# Patient Record
Sex: Female | Born: 1955 | Race: Black or African American | Hispanic: No | Marital: Single | Smoking: Never smoker
Health system: Southern US, Community
[De-identification: ages and names within clinical notes are randomized; demographics above are authoritative.]

## PROBLEM LIST (undated history)

## (undated) HISTORY — PX: BUNIONECTOMY: SHX129

## (undated) HISTORY — PX: OTHER SURGICAL HISTORY: SHX169

---

## 2004-01-06 ENCOUNTER — Other Ambulatory Visit: Admission: RE | Admit: 2004-01-06 | Discharge: 2004-01-06 | Payer: Self-pay | Admitting: Obstetrics and Gynecology

## 2004-11-03 ENCOUNTER — Emergency Department (HOSPITAL_COMMUNITY): Admission: EM | Admit: 2004-11-03 | Discharge: 2004-11-04 | Payer: Self-pay | Admitting: Emergency Medicine

## 2005-06-15 ENCOUNTER — Other Ambulatory Visit: Admission: RE | Admit: 2005-06-15 | Discharge: 2005-06-15 | Payer: Self-pay | Admitting: Obstetrics and Gynecology

## 2005-11-02 ENCOUNTER — Encounter (HOSPITAL_COMMUNITY): Admission: RE | Admit: 2005-11-02 | Discharge: 2006-01-31 | Payer: Self-pay | Admitting: Internal Medicine

## 2005-11-14 ENCOUNTER — Ambulatory Visit (HOSPITAL_COMMUNITY): Admission: RE | Admit: 2005-11-14 | Discharge: 2005-11-14 | Payer: Self-pay | Admitting: Internal Medicine

## 2006-01-09 ENCOUNTER — Other Ambulatory Visit: Admission: RE | Admit: 2006-01-09 | Discharge: 2006-01-09 | Payer: Self-pay | Admitting: Interventional Radiology

## 2006-01-09 ENCOUNTER — Encounter: Admission: RE | Admit: 2006-01-09 | Discharge: 2006-01-09 | Payer: Self-pay | Admitting: Internal Medicine

## 2007-03-04 IMAGING — US US SOFT TISSUE HEAD/NECK
1 series · 14 of 25 positions shown · non-contrast
Comparison: Nuclear medicine scan on 11/03/05.

CLINICAL DATA: Patient has a thyroid nodule.
 THYROID ULTRASOUND:
TECHNIQUE: Ultrasound examination of the thyroid gland and adjacent soft tissue structures was performed.

[Series 1: unknown · 0.09mm/px · 14 of 58 slices shown]
[im 1/58]
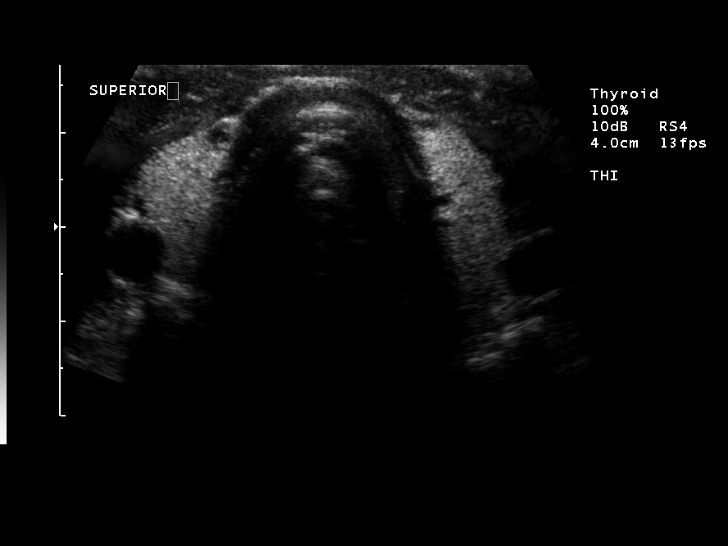
[im 5/58]
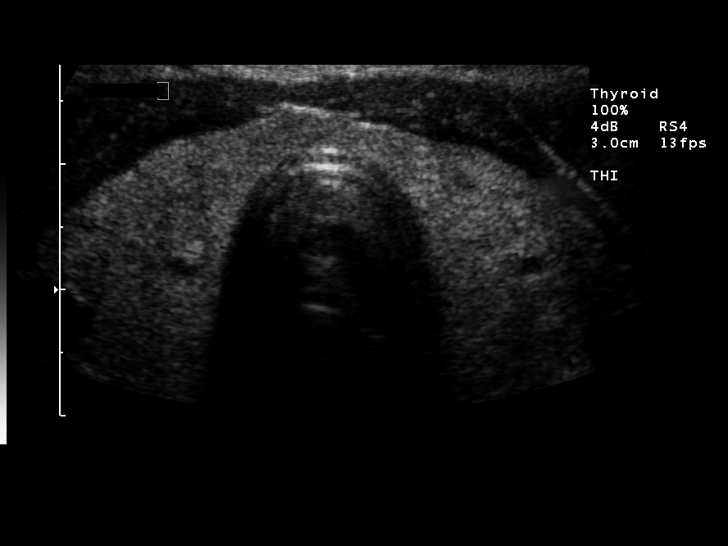
[im 10/58]
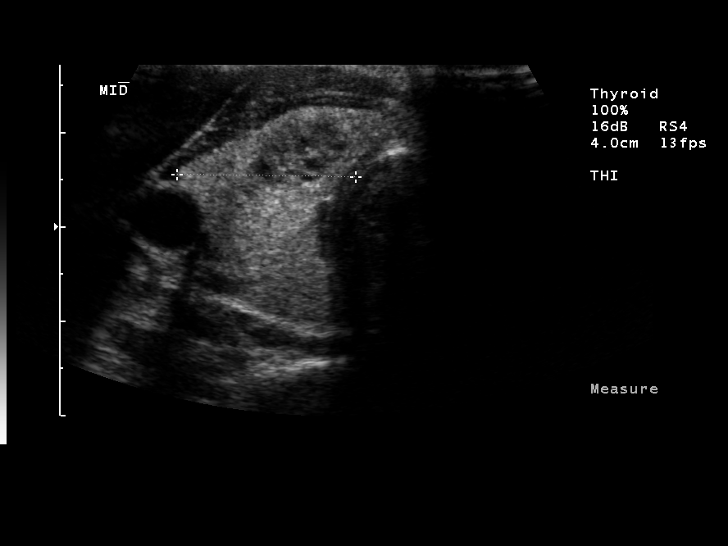
[im 15/58]
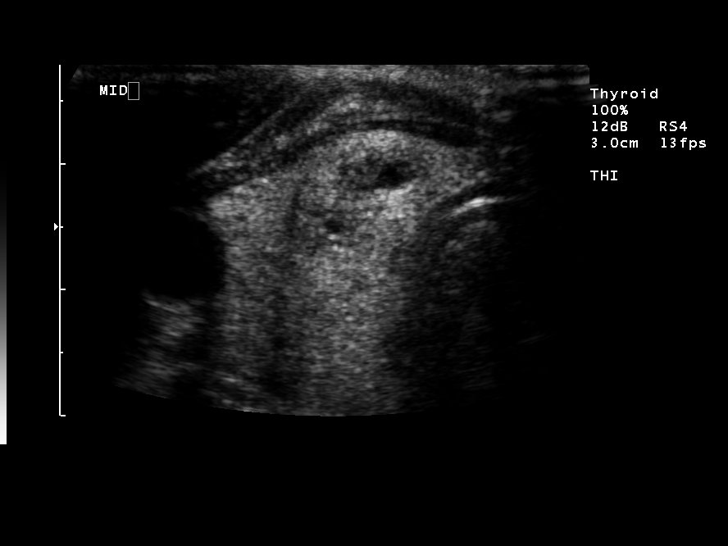
[im 20/58]
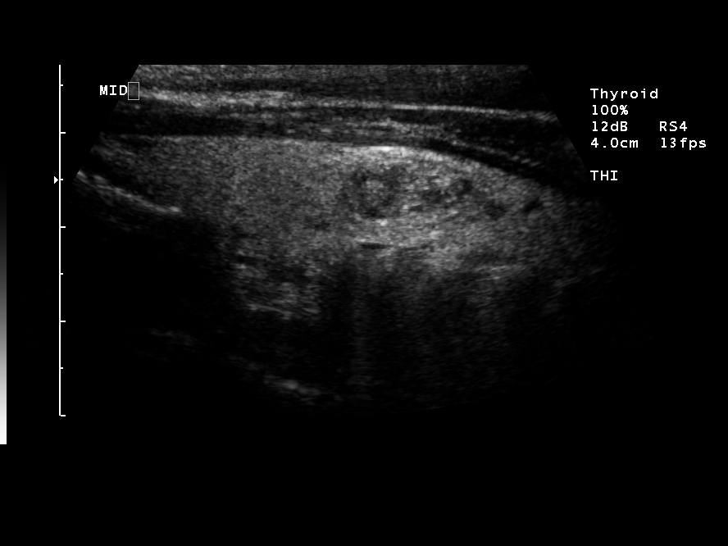
[im 22/58]
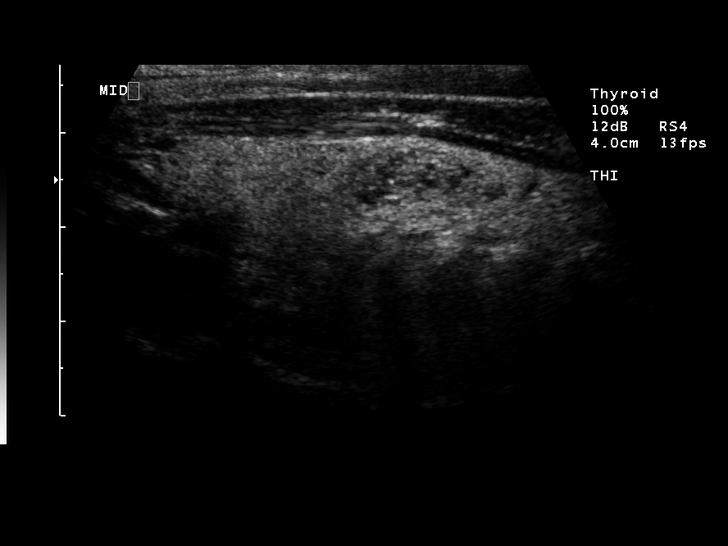
[im 27/58]
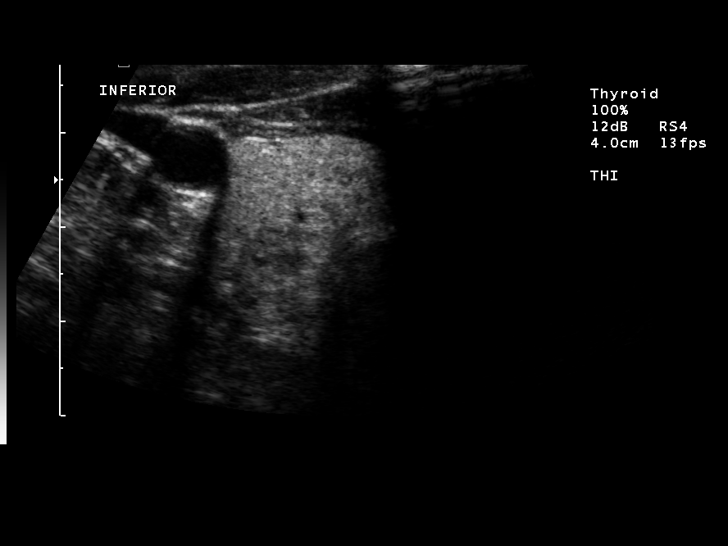
[im 31/58]
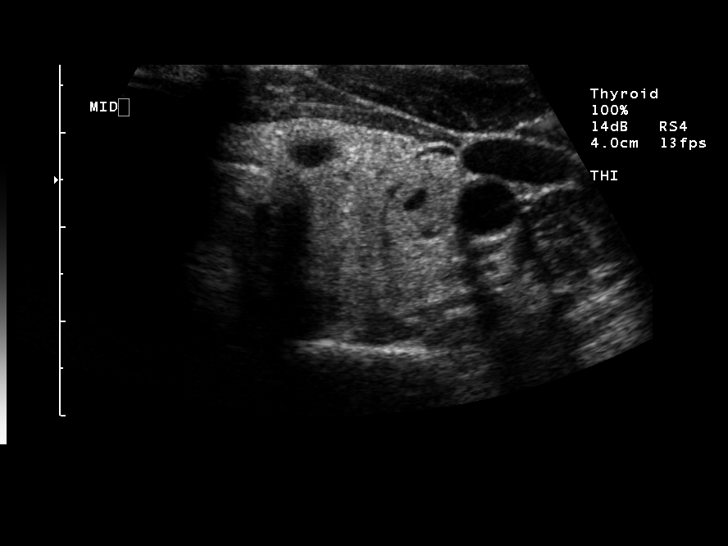
[im 36/58]
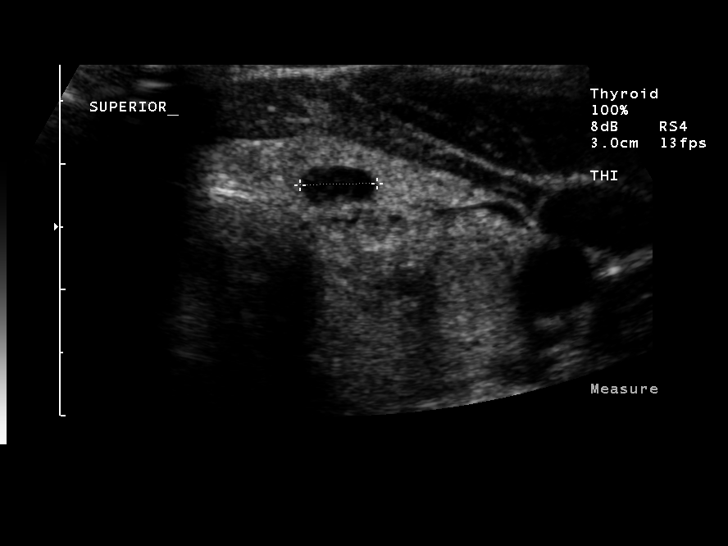
[im 39/58]
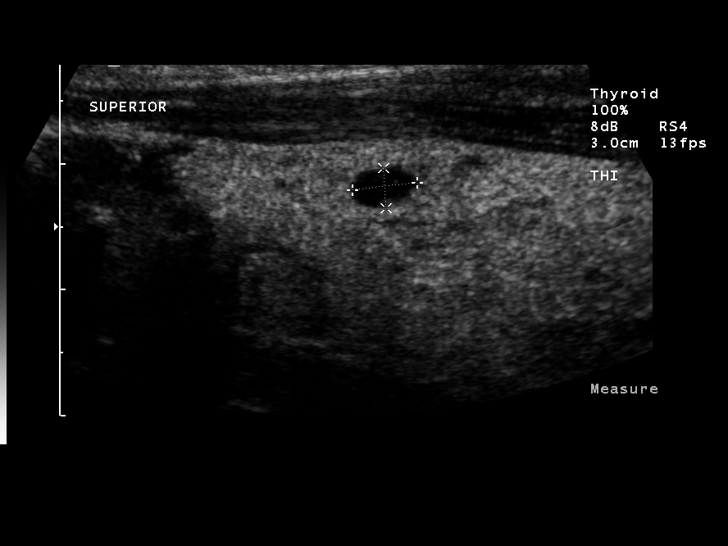
[im 43/58]
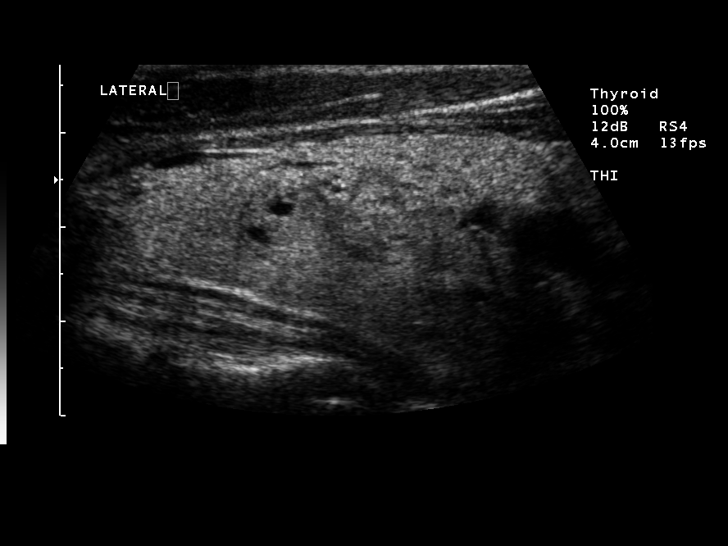
[im 48/58]
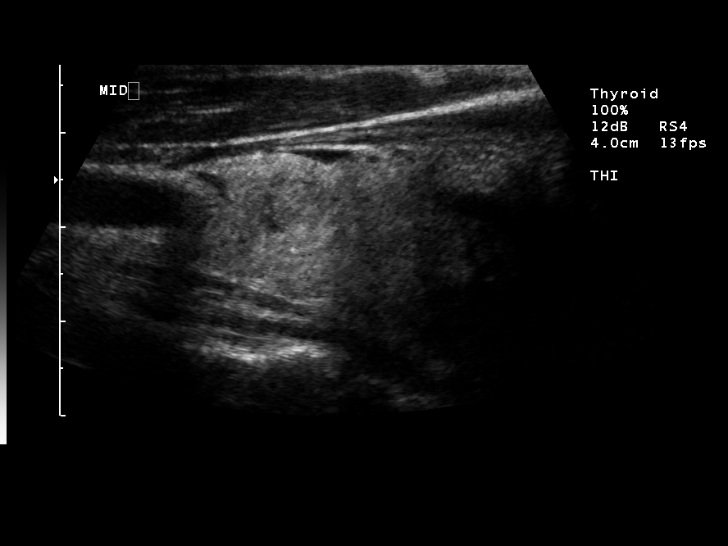
[im 53/58]
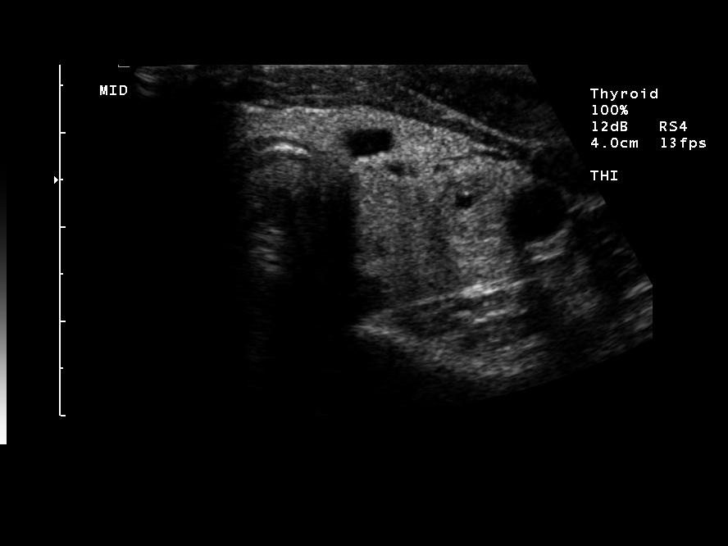
[im 58/58]
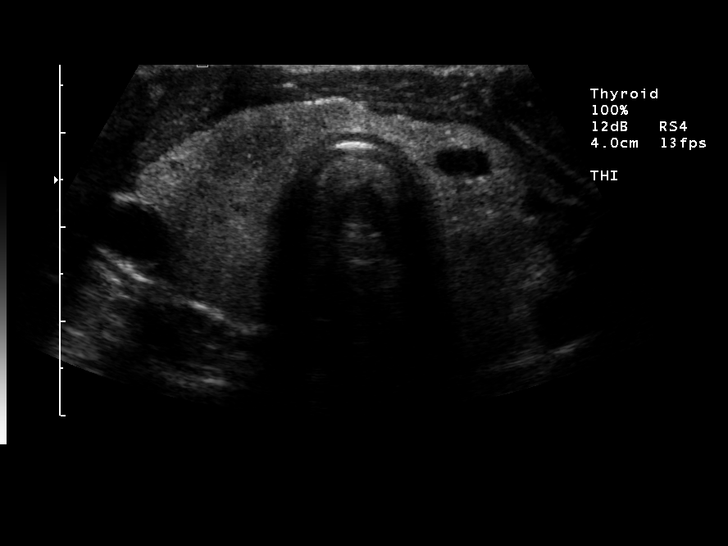

[14 of 25 positions shown; findings below may reference images not displayed]

Patient had a multinodular goiter on isotope with uptake of 11%.  TSH was .276 which is in the low normal range.
FINDINGS: Right lobe measures 5.8 x 2.4 x 1.9 cm and the left lobe measures 5.9 x 2.2 x 1.8 cm.  In the right lobe, there is a 1.5 cm nodule in the mid portion of the right lobe and a 1.1 cm solid nodule in the lower pole of the right lobe.  In the left lobe, there is a 5 x 6 mm cyst in the midpole and a 2.7 x 1.2 x 1.4 cm solid nodule in the lower pole on the left.
IMPRESSION: 1.  Multinodular goiter.  
 2.  Patient appears to be euthyroid.  
 3.  Biopsy however would be suggested of the largest nodule that being the 2.7 cm solid nodule in the left lobe.

## 2007-04-29 IMAGING — US US BIOPSY
1 series · 6 of 6 positions shown · non-contrast
Comparison: #1  Thyroid Ultrasound, 11/14/05 ? [HOSPITAL].
   #2  Nuclear Medicine Thyroid Scan and Uptake, 11/03/05 ? [HOSPITAL]

CLINICAL DATA: 49 year old female with cold nodule within left lobe of the thyroid.  Request for fine needle aspiration. 
 ULTRASOUND GUIDED FINE NEEDLE ASPIRATION, LEFT LOBE OF THYROID:

[Series 1: unknown · 0.06mm/px · 6 of 6 slices shown]
[im 1/6]
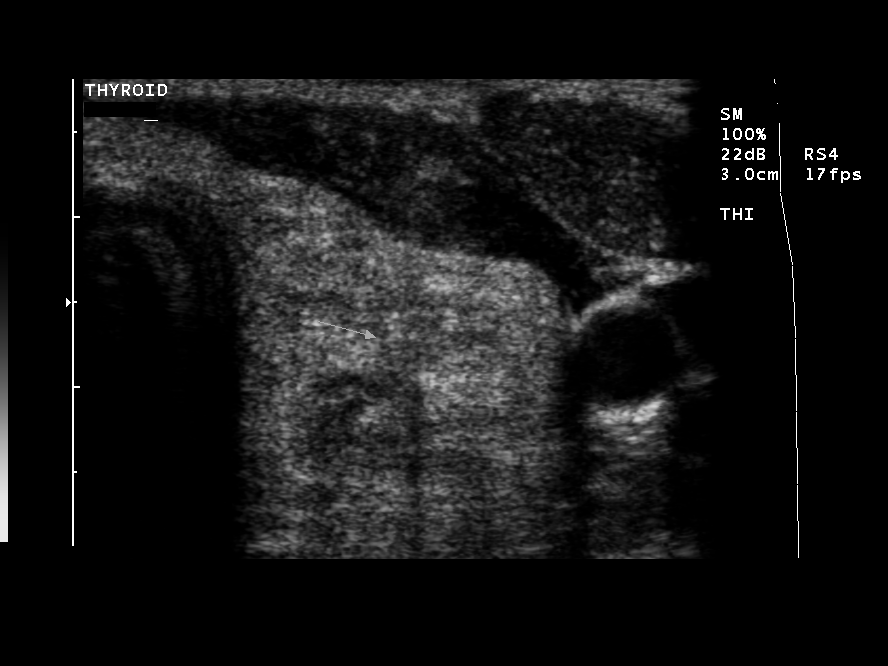
[im 2/6]
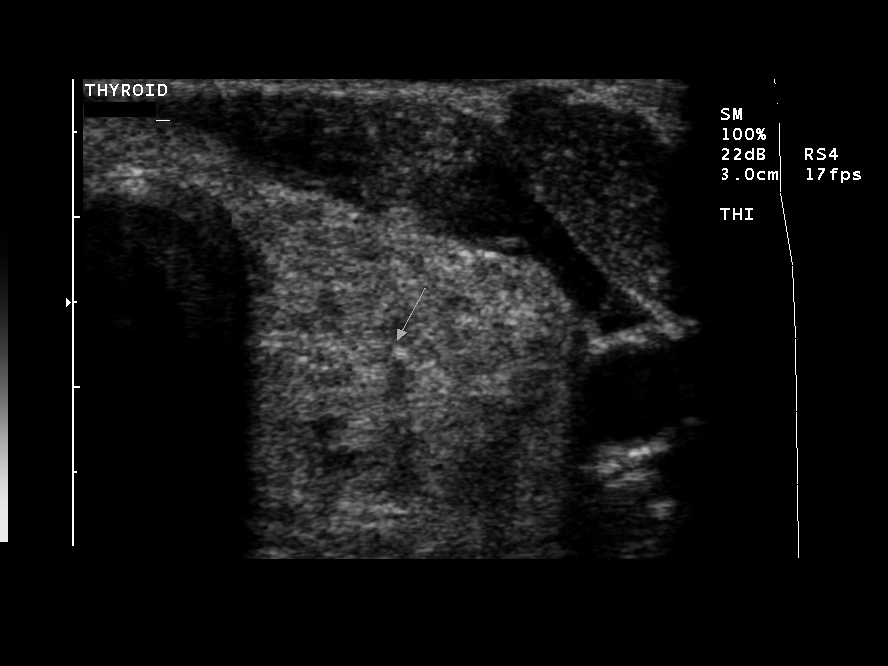
[im 3/6]
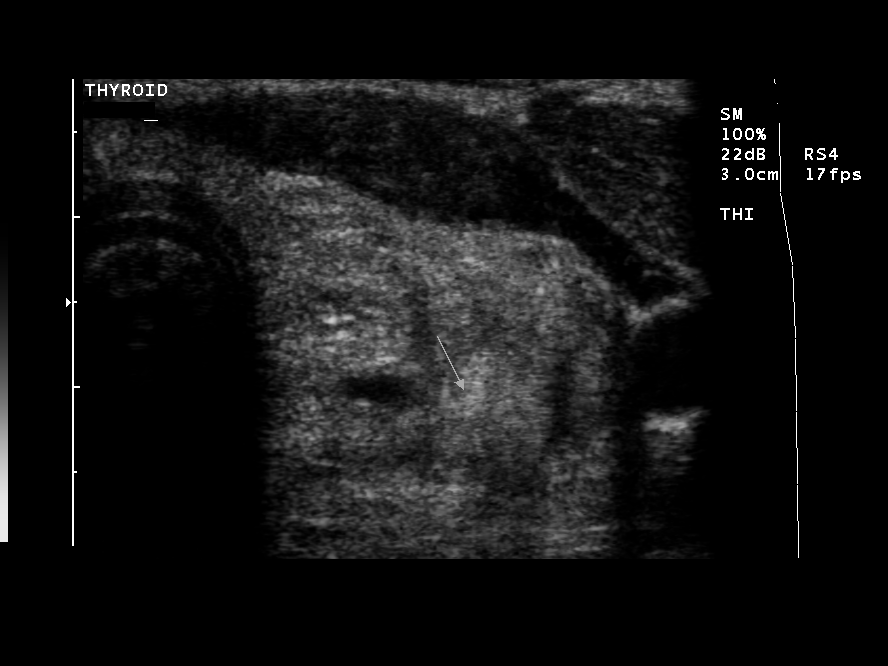
[im 4/6]
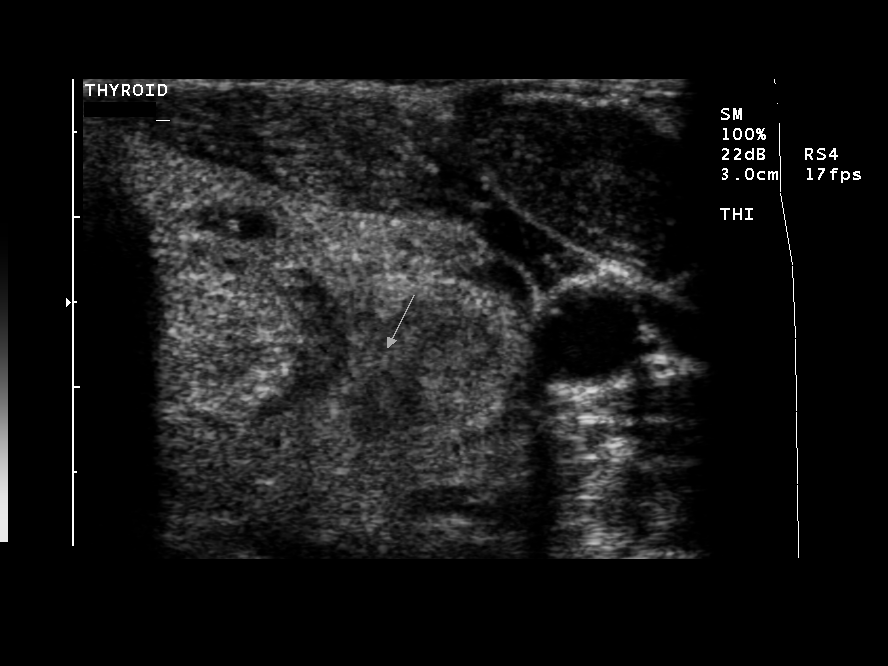
[im 5/6]
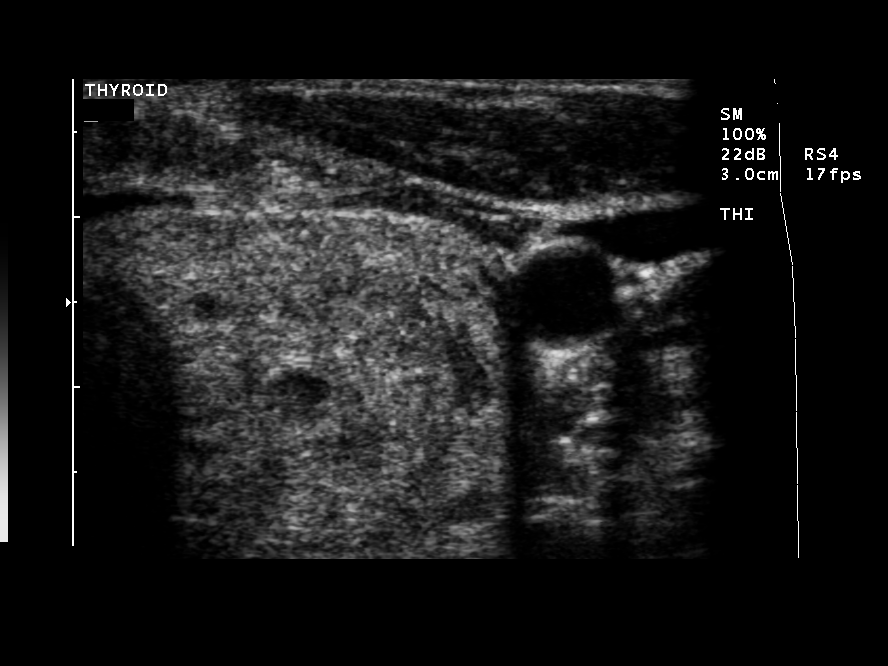
[im 6/6]
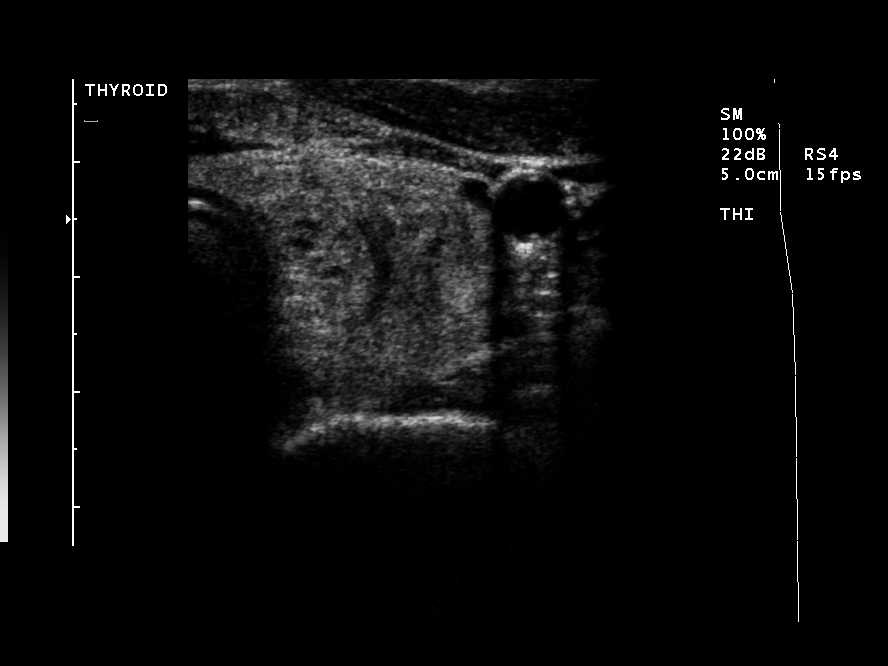

[6 of 6 positions shown; findings below may reference images not displayed]

FINDINGS: The above procedure was thoroughly discussed with the patient and written informed consent was obtained.
 Ultrasound was then performed to localize and mark an adequate site for the biopsy.  The patient was then prepped and draped in a normal sterile fashion, 1% Lidocaine was used for local anesthesia.  Using direct ultrasound guidance, four passes were made using a 25 gauge hypodermic needle into the dominant nodule located within the left lobe of the thyroid measuring approximately 2.7 cm in diameter.  Ultrasound confirmed placement of the needle on all four occasions.  The specimens were given to pathology for further analysis.  Post procedure imaging demonstrated no hematoma or immediate complication. The patient tolerated the procedure well.
IMPRESSION: Successful ultrasound guided fine needle aspiration, dominant nodule, left lobe of the thyroid.  Final pathology pending.

## 2010-12-24 ENCOUNTER — Other Ambulatory Visit: Payer: Self-pay | Admitting: Internal Medicine

## 2010-12-24 ENCOUNTER — Other Ambulatory Visit (HOSPITAL_COMMUNITY): Payer: Self-pay | Admitting: Internal Medicine

## 2010-12-24 DIAGNOSIS — Z1239 Encounter for other screening for malignant neoplasm of breast: Secondary | ICD-10-CM

## 2010-12-24 DIAGNOSIS — R928 Other abnormal and inconclusive findings on diagnostic imaging of breast: Secondary | ICD-10-CM

## 2017-10-05 ENCOUNTER — Other Ambulatory Visit: Payer: Self-pay | Admitting: Family Medicine

## 2017-10-05 DIAGNOSIS — R29898 Other symptoms and signs involving the musculoskeletal system: Secondary | ICD-10-CM

## 2017-10-29 ENCOUNTER — Other Ambulatory Visit: Payer: Self-pay

## 2017-12-20 ENCOUNTER — Ambulatory Visit: Payer: Self-pay | Admitting: Neurology

## 2020-12-07 DIAGNOSIS — H524 Presbyopia: Secondary | ICD-10-CM | POA: Diagnosis not present

## 2021-01-14 DIAGNOSIS — M4807 Spinal stenosis, lumbosacral region: Secondary | ICD-10-CM | POA: Diagnosis not present

## 2021-01-14 DIAGNOSIS — M47816 Spondylosis without myelopathy or radiculopathy, lumbar region: Secondary | ICD-10-CM | POA: Diagnosis not present

## 2021-01-14 DIAGNOSIS — M5136 Other intervertebral disc degeneration, lumbar region: Secondary | ICD-10-CM | POA: Diagnosis not present

## 2021-01-25 DIAGNOSIS — R2681 Unsteadiness on feet: Secondary | ICD-10-CM | POA: Diagnosis not present

## 2021-01-25 DIAGNOSIS — M6281 Muscle weakness (generalized): Secondary | ICD-10-CM | POA: Diagnosis not present

## 2021-01-25 DIAGNOSIS — M5125 Other intervertebral disc displacement, thoracolumbar region: Secondary | ICD-10-CM | POA: Diagnosis not present

## 2021-01-25 DIAGNOSIS — M549 Dorsalgia, unspecified: Secondary | ICD-10-CM | POA: Diagnosis not present

## 2021-02-22 DIAGNOSIS — R269 Unspecified abnormalities of gait and mobility: Secondary | ICD-10-CM | POA: Diagnosis not present

## 2021-02-22 DIAGNOSIS — M21372 Foot drop, left foot: Secondary | ICD-10-CM | POA: Diagnosis not present

## 2021-03-15 DIAGNOSIS — R29898 Other symptoms and signs involving the musculoskeletal system: Secondary | ICD-10-CM | POA: Diagnosis not present

## 2021-04-27 DIAGNOSIS — M47812 Spondylosis without myelopathy or radiculopathy, cervical region: Secondary | ICD-10-CM | POA: Diagnosis not present

## 2021-04-27 DIAGNOSIS — R269 Unspecified abnormalities of gait and mobility: Secondary | ICD-10-CM | POA: Diagnosis not present

## 2021-04-27 DIAGNOSIS — R2689 Other abnormalities of gait and mobility: Secondary | ICD-10-CM | POA: Diagnosis not present

## 2021-04-29 DIAGNOSIS — M6281 Muscle weakness (generalized): Secondary | ICD-10-CM | POA: Diagnosis not present

## 2021-04-29 DIAGNOSIS — R269 Unspecified abnormalities of gait and mobility: Secondary | ICD-10-CM | POA: Diagnosis not present

## 2021-05-13 DIAGNOSIS — M79662 Pain in left lower leg: Secondary | ICD-10-CM | POA: Diagnosis not present

## 2021-05-13 DIAGNOSIS — R296 Repeated falls: Secondary | ICD-10-CM | POA: Diagnosis not present

## 2021-05-13 DIAGNOSIS — M6281 Muscle weakness (generalized): Secondary | ICD-10-CM | POA: Diagnosis not present

## 2021-05-13 DIAGNOSIS — M2560 Stiffness of unspecified joint, not elsewhere classified: Secondary | ICD-10-CM | POA: Diagnosis not present

## 2021-05-13 DIAGNOSIS — R293 Abnormal posture: Secondary | ICD-10-CM | POA: Diagnosis not present

## 2021-05-13 DIAGNOSIS — R262 Difficulty in walking, not elsewhere classified: Secondary | ICD-10-CM | POA: Diagnosis not present

## 2021-05-24 DIAGNOSIS — M2560 Stiffness of unspecified joint, not elsewhere classified: Secondary | ICD-10-CM | POA: Diagnosis not present

## 2021-05-24 DIAGNOSIS — M79662 Pain in left lower leg: Secondary | ICD-10-CM | POA: Diagnosis not present

## 2021-05-24 DIAGNOSIS — R262 Difficulty in walking, not elsewhere classified: Secondary | ICD-10-CM | POA: Diagnosis not present

## 2021-05-24 DIAGNOSIS — R293 Abnormal posture: Secondary | ICD-10-CM | POA: Diagnosis not present

## 2021-05-24 DIAGNOSIS — M6281 Muscle weakness (generalized): Secondary | ICD-10-CM | POA: Diagnosis not present

## 2021-05-24 DIAGNOSIS — R296 Repeated falls: Secondary | ICD-10-CM | POA: Diagnosis not present

## 2021-05-26 DIAGNOSIS — M79662 Pain in left lower leg: Secondary | ICD-10-CM | POA: Diagnosis not present

## 2021-05-26 DIAGNOSIS — R296 Repeated falls: Secondary | ICD-10-CM | POA: Diagnosis not present

## 2021-05-26 DIAGNOSIS — M6281 Muscle weakness (generalized): Secondary | ICD-10-CM | POA: Diagnosis not present

## 2021-05-26 DIAGNOSIS — M2560 Stiffness of unspecified joint, not elsewhere classified: Secondary | ICD-10-CM | POA: Diagnosis not present

## 2021-05-26 DIAGNOSIS — R262 Difficulty in walking, not elsewhere classified: Secondary | ICD-10-CM | POA: Diagnosis not present

## 2021-05-26 DIAGNOSIS — R293 Abnormal posture: Secondary | ICD-10-CM | POA: Diagnosis not present

## 2021-06-01 DIAGNOSIS — M79662 Pain in left lower leg: Secondary | ICD-10-CM | POA: Diagnosis not present

## 2021-06-01 DIAGNOSIS — M2560 Stiffness of unspecified joint, not elsewhere classified: Secondary | ICD-10-CM | POA: Diagnosis not present

## 2021-06-01 DIAGNOSIS — R296 Repeated falls: Secondary | ICD-10-CM | POA: Diagnosis not present

## 2021-06-01 DIAGNOSIS — R262 Difficulty in walking, not elsewhere classified: Secondary | ICD-10-CM | POA: Diagnosis not present

## 2021-06-01 DIAGNOSIS — R293 Abnormal posture: Secondary | ICD-10-CM | POA: Diagnosis not present

## 2021-06-01 DIAGNOSIS — M6281 Muscle weakness (generalized): Secondary | ICD-10-CM | POA: Diagnosis not present

## 2021-06-04 DIAGNOSIS — M79662 Pain in left lower leg: Secondary | ICD-10-CM | POA: Diagnosis not present

## 2021-06-04 DIAGNOSIS — M2560 Stiffness of unspecified joint, not elsewhere classified: Secondary | ICD-10-CM | POA: Diagnosis not present

## 2021-06-04 DIAGNOSIS — R293 Abnormal posture: Secondary | ICD-10-CM | POA: Diagnosis not present

## 2021-06-04 DIAGNOSIS — M6281 Muscle weakness (generalized): Secondary | ICD-10-CM | POA: Diagnosis not present

## 2021-06-04 DIAGNOSIS — R262 Difficulty in walking, not elsewhere classified: Secondary | ICD-10-CM | POA: Diagnosis not present

## 2021-06-04 DIAGNOSIS — R296 Repeated falls: Secondary | ICD-10-CM | POA: Diagnosis not present

## 2021-06-08 DIAGNOSIS — M2560 Stiffness of unspecified joint, not elsewhere classified: Secondary | ICD-10-CM | POA: Diagnosis not present

## 2021-06-08 DIAGNOSIS — R293 Abnormal posture: Secondary | ICD-10-CM | POA: Diagnosis not present

## 2021-06-08 DIAGNOSIS — R262 Difficulty in walking, not elsewhere classified: Secondary | ICD-10-CM | POA: Diagnosis not present

## 2021-06-08 DIAGNOSIS — M6281 Muscle weakness (generalized): Secondary | ICD-10-CM | POA: Diagnosis not present

## 2021-06-08 DIAGNOSIS — M79662 Pain in left lower leg: Secondary | ICD-10-CM | POA: Diagnosis not present

## 2021-06-08 DIAGNOSIS — R296 Repeated falls: Secondary | ICD-10-CM | POA: Diagnosis not present

## 2021-06-11 DIAGNOSIS — R296 Repeated falls: Secondary | ICD-10-CM | POA: Diagnosis not present

## 2021-06-11 DIAGNOSIS — M2560 Stiffness of unspecified joint, not elsewhere classified: Secondary | ICD-10-CM | POA: Diagnosis not present

## 2021-06-11 DIAGNOSIS — R293 Abnormal posture: Secondary | ICD-10-CM | POA: Diagnosis not present

## 2021-06-11 DIAGNOSIS — M79662 Pain in left lower leg: Secondary | ICD-10-CM | POA: Diagnosis not present

## 2021-06-11 DIAGNOSIS — M6281 Muscle weakness (generalized): Secondary | ICD-10-CM | POA: Diagnosis not present

## 2021-06-11 DIAGNOSIS — R262 Difficulty in walking, not elsewhere classified: Secondary | ICD-10-CM | POA: Diagnosis not present

## 2021-06-15 DIAGNOSIS — R296 Repeated falls: Secondary | ICD-10-CM | POA: Diagnosis not present

## 2021-06-15 DIAGNOSIS — M2560 Stiffness of unspecified joint, not elsewhere classified: Secondary | ICD-10-CM | POA: Diagnosis not present

## 2021-06-15 DIAGNOSIS — R293 Abnormal posture: Secondary | ICD-10-CM | POA: Diagnosis not present

## 2021-06-15 DIAGNOSIS — R262 Difficulty in walking, not elsewhere classified: Secondary | ICD-10-CM | POA: Diagnosis not present

## 2021-06-15 DIAGNOSIS — M6281 Muscle weakness (generalized): Secondary | ICD-10-CM | POA: Diagnosis not present

## 2021-06-15 DIAGNOSIS — M79662 Pain in left lower leg: Secondary | ICD-10-CM | POA: Diagnosis not present

## 2021-06-18 DIAGNOSIS — R262 Difficulty in walking, not elsewhere classified: Secondary | ICD-10-CM | POA: Diagnosis not present

## 2021-06-18 DIAGNOSIS — R296 Repeated falls: Secondary | ICD-10-CM | POA: Diagnosis not present

## 2021-06-18 DIAGNOSIS — M6281 Muscle weakness (generalized): Secondary | ICD-10-CM | POA: Diagnosis not present

## 2021-06-18 DIAGNOSIS — M79662 Pain in left lower leg: Secondary | ICD-10-CM | POA: Diagnosis not present

## 2021-06-18 DIAGNOSIS — M2560 Stiffness of unspecified joint, not elsewhere classified: Secondary | ICD-10-CM | POA: Diagnosis not present

## 2021-06-18 DIAGNOSIS — R293 Abnormal posture: Secondary | ICD-10-CM | POA: Diagnosis not present

## 2021-06-28 DIAGNOSIS — H43313 Vitreous membranes and strands, bilateral: Secondary | ICD-10-CM | POA: Diagnosis not present

## 2021-06-28 DIAGNOSIS — H5340 Unspecified visual field defects: Secondary | ICD-10-CM | POA: Diagnosis not present

## 2021-06-29 DIAGNOSIS — M6281 Muscle weakness (generalized): Secondary | ICD-10-CM | POA: Diagnosis not present

## 2021-06-29 DIAGNOSIS — M79662 Pain in left lower leg: Secondary | ICD-10-CM | POA: Diagnosis not present

## 2021-06-29 DIAGNOSIS — M2560 Stiffness of unspecified joint, not elsewhere classified: Secondary | ICD-10-CM | POA: Diagnosis not present

## 2021-06-29 DIAGNOSIS — R262 Difficulty in walking, not elsewhere classified: Secondary | ICD-10-CM | POA: Diagnosis not present

## 2021-06-29 DIAGNOSIS — R293 Abnormal posture: Secondary | ICD-10-CM | POA: Diagnosis not present

## 2021-06-29 DIAGNOSIS — R296 Repeated falls: Secondary | ICD-10-CM | POA: Diagnosis not present

## 2021-07-02 DIAGNOSIS — M79662 Pain in left lower leg: Secondary | ICD-10-CM | POA: Diagnosis not present

## 2021-07-02 DIAGNOSIS — R296 Repeated falls: Secondary | ICD-10-CM | POA: Diagnosis not present

## 2021-07-02 DIAGNOSIS — M6281 Muscle weakness (generalized): Secondary | ICD-10-CM | POA: Diagnosis not present

## 2021-07-02 DIAGNOSIS — M2560 Stiffness of unspecified joint, not elsewhere classified: Secondary | ICD-10-CM | POA: Diagnosis not present

## 2021-07-02 DIAGNOSIS — R262 Difficulty in walking, not elsewhere classified: Secondary | ICD-10-CM | POA: Diagnosis not present

## 2021-07-02 DIAGNOSIS — R293 Abnormal posture: Secondary | ICD-10-CM | POA: Diagnosis not present

## 2021-07-06 DIAGNOSIS — M79662 Pain in left lower leg: Secondary | ICD-10-CM | POA: Diagnosis not present

## 2021-07-06 DIAGNOSIS — R262 Difficulty in walking, not elsewhere classified: Secondary | ICD-10-CM | POA: Diagnosis not present

## 2021-07-06 DIAGNOSIS — R293 Abnormal posture: Secondary | ICD-10-CM | POA: Diagnosis not present

## 2021-07-06 DIAGNOSIS — M2560 Stiffness of unspecified joint, not elsewhere classified: Secondary | ICD-10-CM | POA: Diagnosis not present

## 2021-07-06 DIAGNOSIS — R296 Repeated falls: Secondary | ICD-10-CM | POA: Diagnosis not present

## 2021-07-06 DIAGNOSIS — M6281 Muscle weakness (generalized): Secondary | ICD-10-CM | POA: Diagnosis not present

## 2021-07-09 DIAGNOSIS — M2560 Stiffness of unspecified joint, not elsewhere classified: Secondary | ICD-10-CM | POA: Diagnosis not present

## 2021-07-09 DIAGNOSIS — M79662 Pain in left lower leg: Secondary | ICD-10-CM | POA: Diagnosis not present

## 2021-07-09 DIAGNOSIS — R293 Abnormal posture: Secondary | ICD-10-CM | POA: Diagnosis not present

## 2021-07-09 DIAGNOSIS — M6281 Muscle weakness (generalized): Secondary | ICD-10-CM | POA: Diagnosis not present

## 2021-07-09 DIAGNOSIS — R262 Difficulty in walking, not elsewhere classified: Secondary | ICD-10-CM | POA: Diagnosis not present

## 2021-07-09 DIAGNOSIS — R296 Repeated falls: Secondary | ICD-10-CM | POA: Diagnosis not present

## 2021-07-13 DIAGNOSIS — R293 Abnormal posture: Secondary | ICD-10-CM | POA: Diagnosis not present

## 2021-07-13 DIAGNOSIS — M6281 Muscle weakness (generalized): Secondary | ICD-10-CM | POA: Diagnosis not present

## 2021-07-13 DIAGNOSIS — R296 Repeated falls: Secondary | ICD-10-CM | POA: Diagnosis not present

## 2021-07-13 DIAGNOSIS — M2560 Stiffness of unspecified joint, not elsewhere classified: Secondary | ICD-10-CM | POA: Diagnosis not present

## 2021-07-13 DIAGNOSIS — M79662 Pain in left lower leg: Secondary | ICD-10-CM | POA: Diagnosis not present

## 2021-07-13 DIAGNOSIS — R262 Difficulty in walking, not elsewhere classified: Secondary | ICD-10-CM | POA: Diagnosis not present

## 2021-07-16 DIAGNOSIS — M2560 Stiffness of unspecified joint, not elsewhere classified: Secondary | ICD-10-CM | POA: Diagnosis not present

## 2021-07-16 DIAGNOSIS — M6281 Muscle weakness (generalized): Secondary | ICD-10-CM | POA: Diagnosis not present

## 2021-07-16 DIAGNOSIS — R293 Abnormal posture: Secondary | ICD-10-CM | POA: Diagnosis not present

## 2021-07-16 DIAGNOSIS — R296 Repeated falls: Secondary | ICD-10-CM | POA: Diagnosis not present

## 2021-07-16 DIAGNOSIS — R262 Difficulty in walking, not elsewhere classified: Secondary | ICD-10-CM | POA: Diagnosis not present

## 2021-07-16 DIAGNOSIS — M79662 Pain in left lower leg: Secondary | ICD-10-CM | POA: Diagnosis not present

## 2021-07-20 DIAGNOSIS — R262 Difficulty in walking, not elsewhere classified: Secondary | ICD-10-CM | POA: Diagnosis not present

## 2021-07-20 DIAGNOSIS — M79662 Pain in left lower leg: Secondary | ICD-10-CM | POA: Diagnosis not present

## 2021-07-20 DIAGNOSIS — M6281 Muscle weakness (generalized): Secondary | ICD-10-CM | POA: Diagnosis not present

## 2021-07-20 DIAGNOSIS — R296 Repeated falls: Secondary | ICD-10-CM | POA: Diagnosis not present

## 2021-07-20 DIAGNOSIS — M2560 Stiffness of unspecified joint, not elsewhere classified: Secondary | ICD-10-CM | POA: Diagnosis not present

## 2021-07-20 DIAGNOSIS — R293 Abnormal posture: Secondary | ICD-10-CM | POA: Diagnosis not present

## 2021-07-23 DIAGNOSIS — R296 Repeated falls: Secondary | ICD-10-CM | POA: Diagnosis not present

## 2021-07-23 DIAGNOSIS — M6281 Muscle weakness (generalized): Secondary | ICD-10-CM | POA: Diagnosis not present

## 2021-07-23 DIAGNOSIS — R293 Abnormal posture: Secondary | ICD-10-CM | POA: Diagnosis not present

## 2021-07-23 DIAGNOSIS — M79662 Pain in left lower leg: Secondary | ICD-10-CM | POA: Diagnosis not present

## 2021-07-23 DIAGNOSIS — M2560 Stiffness of unspecified joint, not elsewhere classified: Secondary | ICD-10-CM | POA: Diagnosis not present

## 2021-07-23 DIAGNOSIS — R262 Difficulty in walking, not elsewhere classified: Secondary | ICD-10-CM | POA: Diagnosis not present

## 2021-08-09 ENCOUNTER — Telehealth: Payer: Self-pay | Admitting: *Deleted

## 2021-08-09 ENCOUNTER — Encounter: Payer: Self-pay | Admitting: Neurology

## 2021-08-09 ENCOUNTER — Other Ambulatory Visit: Payer: Self-pay

## 2021-08-09 ENCOUNTER — Ambulatory Visit: Payer: Medicare Other | Admitting: Neurology

## 2021-08-09 VITALS — BP 113/73 | HR 72 | Ht 62.0 in | Wt 116.0 lb

## 2021-08-09 DIAGNOSIS — R799 Abnormal finding of blood chemistry, unspecified: Secondary | ICD-10-CM | POA: Diagnosis not present

## 2021-08-09 DIAGNOSIS — G35 Multiple sclerosis: Secondary | ICD-10-CM | POA: Insufficient documentation

## 2021-08-09 DIAGNOSIS — R269 Unspecified abnormalities of gait and mobility: Secondary | ICD-10-CM

## 2021-08-09 DIAGNOSIS — R7989 Other specified abnormal findings of blood chemistry: Secondary | ICD-10-CM | POA: Diagnosis not present

## 2021-08-09 DIAGNOSIS — E538 Deficiency of other specified B group vitamins: Secondary | ICD-10-CM | POA: Diagnosis not present

## 2021-08-09 DIAGNOSIS — E079 Disorder of thyroid, unspecified: Secondary | ICD-10-CM | POA: Diagnosis not present

## 2021-08-09 DIAGNOSIS — R7309 Other abnormal glucose: Secondary | ICD-10-CM | POA: Diagnosis not present

## 2021-08-09 NOTE — Telephone Encounter (Signed)
Placed JCV antibody specimen out in the [facility] box for pick up.  

## 2021-08-09 NOTE — Progress Notes (Signed)
Chief Complaint  Patient presents with   New Patient (Initial Visit)    Rm 16, with husband, here to discuss gait abnormalities, report no pain, using cane, reports many falls, around 10 in the last 4 weeks        ASSESSMENT AND PLAN  Lauren Snyder is a 65 y.o. female   Progressive worsening gait abnormality since 2016  Significant abnormal MRI of the brain and cervical spine, most consistent with multiple sclerosis, patient did not give a clear relapsing admitting episode  Repeat MRI of the brain and cervical spine with without contrast  Laboratory evaluations  Will also consider lumbar puncture  Return to clinic in 5 to 6 weeks to discuss treatment options   DIAGNOSTIC DATA (LABS, IMAGING, TESTING) - I reviewed patient records, labs, notes, testing and imaging myself where available. EMG nerve conduction study on March 15, 2021, of bilateral lower extremity was within normal limit, no evidence of polyneuropathy, limited needle examination showed no significant abnormality  Bilateral peroneal to EDB, tibial motor responses were normal.  Bilateral sural sensory responses were normal, H reflex were normal and symmetric  MRI of the brain on July 28, 2021 reported no evidence of acute abnormality, moderate to advanced chronic periventricular white matter changes, some of oval-shaped, perpendicular to ventricle MRI of cervical spine old, called volume loss, abnormal T2 signal at the C2 level, favors demyelinating disease, mild multilevel degenerative changes    MEDICAL HISTORY:  Lauren Snyder is a 65 year old female, seen in request by her neurosurgeon Dr. Coletta Memos, evaluation of abnormal MRIs, gait abnormality, her primary care physician is Dr. Mikeal Hawthorne, Cheri Rous, initial evaluation was on August 09, 2021  I reviewed and summarized the referring note. PMHx  She retired around 2016, even before retirement, she noted mild gait abnormality, complaints she often   tripped on the hallway, her left leg gave her trouble,  Since retirement, she becomes increased sedentary, at the same time, she noticed increased gait abnormality, dragging her left leg more, now began to fall frequently, rely on forefoot cane, she denies bowel and bladder incontinence, denies visual loss, denies dysarthria or dysphagia  She denies significant relapsing remitting multiple sclerosis   Around 2017, she was noted to have memory loss, tends to repeat herself, gradually getting worse, spend a lot of time in front of TV  We personally reviewed MRI of the brain, and cervical spine without contrast from [facility]  MRI of the brain on July 28, 2021 reported no evidence of acute abnormality, moderate to advanced chronic periventricular white matter changes, some of oval-shaped, perpendicular to ventricle  MRI of cervical spine old, called volume loss, abnormal T2 signal at the C2 level, favors demyelinating disease, mild multilevel degenerative changes  PHYSICAL EXAM:   Vitals:   08/09/21 1344  BP: 113/73  Pulse: 72  Weight: 116 lb (52.6 kg)  Height: 5\' 2"  (1.575 m)   Not recorded     Body mass index is 21.22 kg/m.  PHYSICAL EXAMNIATION:  Gen: NAD, conversant, well nourised, well groomed                     Cardiovascular: Regular rate rhythm, no peripheral edema, warm, nontender. Eyes: Conjunctivae clear without exudates or hemorrhage Neck: Supple, no carotid bruits. Pulmonary: Clear to auscultation bilaterally   NEUROLOGICAL EXAM:  MENTAL STATUS: Speech:    Speech is normal; fluent and spontaneous with normal comprehension.  Cognition:     Orientation to  time, place and person     Normal recent and remote memory     Normal Attention span and concentration     Normal Language, naming, repeating,spontaneous speech     Fund of knowledge   CRANIAL NERVES: CN II: Visual fields are full to confrontation. Pupils are round equal and briskly  reactive to light. CN III, IV, VI: extraocular movement are normal. No ptosis. CN V: Facial sensation is intact to light touch CN VII: Face is symmetric with normal eye closure  CN VIII: Hearing is normal to causal conversation. CN IX, X: Phonation is normal. CN XI: Head turning and shoulder shrug are intact  MOTOR: Fixation of left arm on rapid rotating movement, mild left shoulder abduction weakness,  REFLEXES: Reflexes are 2+ and symmetric at the biceps, triceps, 2/3 knees, and ankles. Plantar responses are flexor on the right, extensor on left.  SENSORY: Intact to light touch, pinprick and vibratory sensation are intact in fingers and toes.  COORDINATION: There is no trunk or limb dysmetria noted.  GAIT/STANCE: She needs push-up to get up from seated position, unsteady, stiff, dragging left leg  REVIEW OF SYSTEMS:  Full 14 system review of systems performed and notable only for as above All other review of systems were negative.   ALLERGIES: No Known Allergies  HOME MEDICATIONS: No current outpatient medications on file.   No current facility-administered medications for this visit.    PAST MEDICAL HISTORY: History reviewed. No pertinent past medical history.  PAST SURGICAL HISTORY: Past Surgical History:  Procedure Laterality Date   BUNIONECTOMY     fibroid removal      FAMILY HISTORY: History reviewed. No pertinent family history.  SOCIAL HISTORY: Social History   Socioeconomic History   Marital status: Single    Spouse name: Not on file   Number of children: Not on file   Years of education: Not on file   Highest education level: Not on file  Occupational History   Not on file  Tobacco Use   Smoking status: Never   Smokeless tobacco: Never  Substance and Sexual Activity   Alcohol use: Never   Drug use: Never   Sexual activity: Not on file  Other Topics Concern   Not on file  Social History Narrative   Not on file   Social Determinants of  Health   Financial Resource Strain: Not on file  Food Insecurity: Not on file  Transportation Needs: Not on file  Physical Activity: Not on file  Stress: Not on file  Social Connections: Not on file  Intimate Partner Violence: Not on file      Levert Feinstein, M.D. Ph.D.  [facility] [address] [place], [place] [address] Ph: [phone] Fax: [phone]  CC:  Coletta Memos, MD [address] [place],  [place] [address]  Rometta Emery, MD

## 2021-08-12 ENCOUNTER — Telehealth: Payer: Self-pay | Admitting: Neurology

## 2021-08-12 LAB — FERRITIN: Ferritin: 98 ng/mL (ref 15–150)

## 2021-08-12 LAB — CBC WITH DIFFERENTIAL/PLATELET
Basophils Absolute: 0 10*3/uL (ref 0.0–0.2)
Basos: 1 %
EOS (ABSOLUTE): 0.2 10*3/uL (ref 0.0–0.4)
Eos: 5 %
Hematocrit: 37.1 % (ref 34.0–46.6)
Hemoglobin: 12.9 g/dL (ref 11.1–15.9)
Immature Grans (Abs): 0 10*3/uL (ref 0.0–0.1)
Immature Granulocytes: 0 %
Lymphocytes Absolute: 0.8 10*3/uL (ref 0.7–3.1)
Lymphs: 27 %
MCH: 30.6 pg (ref 26.6–33.0)
MCHC: 34.8 g/dL (ref 31.5–35.7)
MCV: 88 fL (ref 79–97)
Monocytes Absolute: 0.3 10*3/uL (ref 0.1–0.9)
Monocytes: 12 %
Neutrophils Absolute: 1.6 10*3/uL (ref 1.4–7.0)
Neutrophils: 55 %
Platelets: 278 10*3/uL (ref 150–450)
RBC: 4.22 x10E6/uL (ref 3.77–5.28)
RDW: 13.1 % (ref 11.7–15.4)
WBC: 2.8 10*3/uL — ABNORMAL LOW (ref 3.4–10.8)

## 2021-08-12 LAB — VITAMIN B12: Vitamin B-12: 919 pg/mL (ref 232–1245)

## 2021-08-12 LAB — VITAMIN D 25 HYDROXY (VIT D DEFICIENCY, FRACTURES): Vit D, 25-Hydroxy: 33.1 ng/mL (ref 30.0–100.0)

## 2021-08-12 LAB — HEPATITIS B SURFACE ANTIGEN: Hepatitis B Surface Ag: NEGATIVE

## 2021-08-12 LAB — MULTIPLE MYELOMA PANEL, SERUM
Albumin SerPl Elph-Mcnc: 4 g/dL (ref 2.9–4.4)
Albumin/Glob SerPl: 1.2 (ref 0.7–1.7)
Alpha 1: 0.2 g/dL (ref 0.0–0.4)
Alpha2 Glob SerPl Elph-Mcnc: 0.7 g/dL (ref 0.4–1.0)
B-Globulin SerPl Elph-Mcnc: 1 g/dL (ref 0.7–1.3)
Gamma Glob SerPl Elph-Mcnc: 1.5 g/dL (ref 0.4–1.8)
Globulin, Total: 3.4 g/dL (ref 2.2–3.9)
IgA/Immunoglobulin A, Serum: 257 mg/dL (ref 87–352)
IgG (Immunoglobin G), Serum: 1451 mg/dL (ref 586–1602)
IgM (Immunoglobulin M), Srm: 107 mg/dL (ref 26–217)
Total Protein: 7.4 g/dL (ref 6.0–8.5)

## 2021-08-12 LAB — C-REACTIVE PROTEIN: CRP: <1 mg/L (ref 0–10)

## 2021-08-12 LAB — HGB A1C W/O EAG: Hgb A1c MFr Bld: 5.5 % (ref 4.8–5.6)

## 2021-08-12 LAB — TSH: TSH: 1.28 u[IU]/mL (ref 0.450–4.500)

## 2021-08-12 LAB — HIV ANTIBODY (ROUTINE TESTING W REFLEX): HIV Screen 4th Generation wRfx: NONREACTIVE

## 2021-08-12 LAB — HEPATITIS C ANTIBODY: Hep C Virus Ab: <0.1 s/co ratio (ref 0.0–0.9)

## 2021-08-12 LAB — COPPER, SERUM: Copper: 123 ug/dL (ref 80–158)

## 2021-08-12 LAB — CK: Total CK: 152 U/L (ref 32–182)

## 2021-08-12 LAB — VARICELLA ZOSTER ANTIBODY, IGG: Varicella zoster IgG: 2377 index (ref >165)

## 2021-08-12 LAB — LYME DISEASE SEROLOGY W/REFLEX: Lyme Total Antibody EIA: NEGATIVE

## 2021-08-12 LAB — HEPATITIS B SURFACE ANTIBODY,QUALITATIVE: Hep B Surface Ab, Qual: NONREACTIVE

## 2021-08-12 LAB — ANA W/REFLEX IF POSITIVE: Anti Nuclear Antibody (ANA): NEGATIVE

## 2021-08-12 LAB — SEDIMENTATION RATE: Sed Rate: 11 mm/hr (ref 0–40)

## 2021-08-12 LAB — RPR: RPR Ser Ql: NONREACTIVE

## 2021-08-12 LAB — HEPATITIS B CORE ANTIBODY, TOTAL: Hep B Core Total Ab: NEGATIVE

## 2021-08-12 LAB — FOLATE: Folate: 15.9 ng/mL (ref >3.0)

## 2021-08-12 NOTE — Telephone Encounter (Signed)
I spoke to the patient. She verbalized understanding of the lab findings.

## 2021-08-12 NOTE — Telephone Encounter (Signed)
LVM for patient to return call.  If patient calls back, please inform her of Dr. Zannie Cove review and findings regarding lab work she had completed.      the only abnormality at laboratory evaluation was mildly decreased WBC of 2.8, this has unknown clinical significance, will have repeat test at her next follow-up visit  Rest of the laboratory evaluation showed no significant abnormality,          Thank you, Cicero Duck, RN

## 2021-08-12 NOTE — Telephone Encounter (Signed)
Please call patient, the only abnormality at laboratory evaluation was mildly decreased WBC of 2.8, this has unknown clinical significance, will have repeat test at her next follow-up visit  Rest of the laboratory evaluation showed no significant abnormality,

## 2021-08-16 NOTE — Telephone Encounter (Signed)
Labs collected 08/09/21:  JCV ab: 2.69 - high

## 2021-08-30 DIAGNOSIS — R030 Elevated blood-pressure reading, without diagnosis of hypertension: Secondary | ICD-10-CM | POA: Diagnosis not present

## 2021-08-30 DIAGNOSIS — R269 Unspecified abnormalities of gait and mobility: Secondary | ICD-10-CM | POA: Diagnosis not present

## 2021-09-10 ENCOUNTER — Ambulatory Visit
Admission: RE | Admit: 2021-09-10 | Discharge: 2021-09-10 | Disposition: A | Discharge status: Home / Self Care | Payer: Self-pay | Source: Ambulatory Visit | Attending: Neurology | Admitting: Neurology

## 2021-09-10 ENCOUNTER — Other Ambulatory Visit: Payer: Self-pay

## 2021-09-10 DIAGNOSIS — G35 Multiple sclerosis: Secondary | ICD-10-CM | POA: Diagnosis not present

## 2021-09-10 MED ORDER — GADOBENATE DIMEGLUMINE 529 MG/ML IV SOLN
10.0000 mL | Freq: Once | INTRAVENOUS | Status: AC | PRN
  Administered 2021-09-10: 10 mL via INTRAVENOUS

## 2021-09-13 ENCOUNTER — Telehealth: Payer: Self-pay | Admitting: Neurology

## 2021-09-13 DIAGNOSIS — R269 Unspecified abnormalities of gait and mobility: Secondary | ICD-10-CM

## 2021-09-13 DIAGNOSIS — G35 Multiple sclerosis: Secondary | ICD-10-CM

## 2021-09-13 NOTE — Telephone Encounter (Signed)
IMPRESSION: This MRI of the cervical spine with and without contrast shows the following: 1.   T2 hyperintense focus associated with mild spinal cord atrophy adjacent to C2.  This appears unchanged compared to the previous MRI.  It does not enhance.  This is consistent with a chronic demyelinating plaque from multiple sclerosis or transverse myelitis. 2.   Mild degenerative changes at C3-C4 and C4-C5 that do not lead to spinal stenosis or nerve root compression.  IMPRESSION: This MRI of the brain with and without contrast shows the following: 1.   Multiple single and confluent T2/FLAIR hyperintense foci in the periventricular, juxtacortical and deep white matter of the hemispheres.  Foci are also noted at the lateral right thalamus and adjacent to C2.  These are consistent with chronic demyelinating plaque associated with multiple sclerosis.  Compared to the MRI from 04/17/2021.  There are no new or enhancing lesions. 2.   Small chronic microhemorrhage in the left posterior pontomesencephalic junction, also seen on the previous MRI.  This could also represent a small cerebral angioma. 3.   No acute findings.  Normal enhancement pattern.   Please call patient, MRI of the brain and cervical cord showed lesions most consistent with multiple sclerosis, no contrast-enhancement  Extensive laboratory evaluation showed decreased WBC of 2.8,  Please let patient coming for CBC, keep follow-up appointment as previously scheduled

## 2021-09-13 NOTE — Telephone Encounter (Addendum)
I spoke to her husband. Provided the MRI results and labs. He will bring her in for repeat CBC w/ diff and keep pending appt 09/27/21 for further discussion with Dr. Terrace Arabia.

## 2021-09-27 ENCOUNTER — Ambulatory Visit: Payer: Medicare Other | Admitting: Neurology

## 2021-09-27 ENCOUNTER — Encounter: Payer: Self-pay | Admitting: Neurology

## 2021-09-27 VITALS — BP 107/65 | HR 61 | Ht 62.0 in | Wt 116.0 lb

## 2021-09-27 DIAGNOSIS — M5136 Other intervertebral disc degeneration, lumbar region: Secondary | ICD-10-CM | POA: Diagnosis not present

## 2021-09-27 DIAGNOSIS — R269 Unspecified abnormalities of gait and mobility: Secondary | ICD-10-CM

## 2021-09-27 DIAGNOSIS — M9902 Segmental and somatic dysfunction of thoracic region: Secondary | ICD-10-CM | POA: Diagnosis not present

## 2021-09-27 DIAGNOSIS — M5134 Other intervertebral disc degeneration, thoracic region: Secondary | ICD-10-CM | POA: Diagnosis not present

## 2021-09-27 DIAGNOSIS — G35 Multiple sclerosis: Secondary | ICD-10-CM

## 2021-09-27 DIAGNOSIS — M9901 Segmental and somatic dysfunction of cervical region: Secondary | ICD-10-CM | POA: Diagnosis not present

## 2021-09-27 DIAGNOSIS — M5032 Other cervical disc degeneration, mid-cervical region, unspecified level: Secondary | ICD-10-CM | POA: Diagnosis not present

## 2021-09-27 DIAGNOSIS — M9903 Segmental and somatic dysfunction of lumbar region: Secondary | ICD-10-CM | POA: Diagnosis not present

## 2021-09-27 NOTE — Patient Instructions (Addendum)
Home  National Multiple Sclerosis Society  Treating MS   Medications    Infused medications        Click to read the Prescribing Information for healthcare professionals. Click to read the Medication Guide for patients.  [email or web address]  IV infusion  Ocrevus (ocrelizumab) every 6 months  Tysabri (natalizumab): monthly

## 2021-09-27 NOTE — Progress Notes (Signed)
Chief Complaint  Patient presents with   Follow-up    New rm, states she is doing well, no new questions or concerns, using cane today       ASSESSMENT AND PLAN  RHILYNN PREYER is a 65 y.o. 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, I have suggested " stronger form of" treatment, due to her progressive gait abnormality, extensive brain and upper cervical lesion load, we went over ocrelizumab in detail, not a very good candidate for Tysabri due to high titer JC virus antibody  Patient's husband decided to get further information before proceed  She never had COVID-vaccine, hepatitis B vaccine, does not planning on to be vaccinated,  Laboratory evaluation including TB QuantiFERON, IgG is baseline,  Return to clinic in 2 months for further discussion of treatment options   DIAGNOSTIC DATA (LABS, IMAGING, TESTING) - I reviewed patient records, labs, notes, testing and imaging myself where available. EMG nerve conduction study on March 15, 2021, of bilateral lower extremity was within normal limit, no evidence of polyneuropathy, limited needle examination showed no significant abnormality  Bilateral peroneal to EDB, tibial motor responses were normal.  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 Coletta Memos, evaluation of abnormal MRIs, gait abnormality, her primary care physician is Dr. Mikeal Hawthorne, Cheri Rous, initial evaluation was on August 09, 2021  I reviewed and summarized the referring note. PMHx  She retired around 2016, even before retirement, she noted mild gait abnormality, complaints she often  tripped on the hallway, her left leg gave her trouble,  Since retirement, she becomes increased sedentary, at the same time, she noticed increased gait abnormality, dragging her left leg more, now began to fall frequently, rely on forefoot cane, she denies bowel and bladder incontinence, denies visual loss, denies dysarthria or dysphagia  She denies significant relapsing remitting multiple sclerosis  Around 2017, she was noted to have memory loss, tends to repeat herself, gradually getting worse, spend a lot of time in front of TV  We personally reviewed MRI of the brain, and cervical spine without contrast from [facility]  MRI of the brain on July 28, 2021 reported no evidence of acute abnormality, moderate to advanced chronic periventricular white matter changes, some of oval-shaped, perpendicular to ventricle  MRI of cervical spine old, called volume loss, abnormal T2 signal at the C2 level, favors demyelinating disease, mild multilevel degenerative changes  UPDATE Sep 27 2021: She is accompanied by her husband at today's visit, continue has memory loss, gait abnormality, slow reaction time, she does has urinary and bowel incontinence occasionally.  We personally reviewed MRI of the brain and cervical spine with  without contrast, T2 hyperintensity focus associated with mild spinal cord atrophy adjacent to C2, no  contrast-enhancement.  Multiple single confluent T2/FLAIR hyperintensity foci in the periventricular, juxtacortical and deep white matter of hemisphere, no contrast-enhancement, small chronic microhemorrhage in the left posterior parietal mesencephalic junction,  Repeat laboratory evaluations on September 14, 2021 showed normal CBC, WBC of 5.8, hemoglobin of 13.4, LDL of 86, normal CMP creatinine of 0.92,  PHYSICAL EXAM:   Vitals:   09/27/21 1248  BP: 107/65  Pulse: 61  Weight: 116 lb (52.6 kg)  Height: 5\' 2"  (1.575 m)   Not recorded     Body mass index is 21.22 kg/m.  PHYSICAL EXAMNIATION:  Gen: NAD, conversant, well nourised, well groomed                     Cardiovascular: Regular rate rhythm, no peripheral edema, warm, nontender. Eyes: Conjunctivae clear without exudates or hemorrhage Neck: Supple, no carotid bruits. Pulmonary: Clear to auscultation bilaterally   NEUROLOGICAL EXAM:  MENTAL STATUS: Speech/cognition: Awake, alert, cooperative on examination, but rely on her husband to answer questions,   CRANIAL NERVES: CN II: Visual fields are full to confrontation. Pupils are round equal and briskly reactive to light. CN III, IV, VI: extraocular movement are normal. No ptosis. CN V: Facial sensation is intact to light touch CN VII: Face is symmetric with normal eye closure  CN VIII: Hearing is normal to causal conversation. CN IX, X: Phonation is normal. CN XI: Head turning and shoulder shrug are intact  MOTOR: Fixation of left arm on rapid rotating movement, mild left shoulder abduction weakness,  REFLEXES: Reflexes are 2+ and symmetric at the biceps, triceps, 2/3 knees, and ankles. Plantar responses are flexor on the right, extensor on left.  SENSORY: Intact to light touch, pinprick and vibratory sensation are intact in fingers and toes.  COORDINATION: There is no trunk or limb dysmetria noted.  GAIT/STANCE: She needs push-up to get up from seated position,  unsteady, stiff, dragging left leg  REVIEW OF SYSTEMS:  Full 14 system review of systems performed and notable only for as above All other review of systems were negative.   ALLERGIES: No Known Allergies  HOME MEDICATIONS: No current outpatient medications on file.   No current facility-administered medications for this visit.    PAST MEDICAL HISTORY: History reviewed. No pertinent past medical history.  PAST SURGICAL HISTORY: Past Surgical History:  Procedure Laterality Date   BUNIONECTOMY     fibroid removal      FAMILY HISTORY: History reviewed. No pertinent family history.  SOCIAL HISTORY: Social History   Socioeconomic History   Marital status: Single    Spouse name: Not on file   Number of children: Not on file   Years of education: Not on file   Highest education level: Not on file  Occupational History   Not on file  Tobacco Use   Smoking status: Never   Smokeless tobacco: Never  Substance and Sexual Activity   Alcohol use: Never   Drug use: Never   Sexual activity: Not on file  Other Topics Concern   Not on file  Social History Narrative   Not on file   Social Determinants of Health   Financial Resource Strain: Not on file  Food Insecurity: Not on file  Transportation Needs: Not on file  Physical Activity: Not on file  Stress: Not on file  Social Connections: Not on file  Intimate Partner Violence: Not on file  Total time spent reviewing the chart, obtaining history, examined patient, ordering tests, documentation, consultations and family, care coordination was 50 minutes   Levert Feinstein, M.D. Ph.D.  [facility] [address] [place], [place] [address] Ph: [phone] Fax: [phone]  CC:  Rometta Emery, MD [address] [place],  [place] [address]  Rometta Emery, MD

## 2021-10-04 ENCOUNTER — Inpatient Hospital Stay: Admission: RE | Admit: 2021-10-04 | Payer: Medicare Other | Source: Ambulatory Visit

## 2021-10-05 ENCOUNTER — Telehealth: Payer: Self-pay | Admitting: Neurology

## 2021-10-05 LAB — QUANTIFERON-TB GOLD PLUS
QuantiFERON Mitogen Value: 0.81 IU/mL
QuantiFERON Nil Value: 0.04 IU/mL
QuantiFERON TB1 Ag Value: 0.06 IU/mL
QuantiFERON TB2 Ag Value: 0.01 IU/mL
QuantiFERON-TB Gold Plus: NEGATIVE

## 2021-10-05 LAB — IGG, IGA, IGM
IgA/Immunoglobulin A, Serum: 281 mg/dL (ref 87–352)
IgG (Immunoglobin G), Serum: 1533 mg/dL (ref 586–1602)
IgM (Immunoglobulin M), Srm: 116 mg/dL (ref 26–217)

## 2021-10-05 NOTE — Telephone Encounter (Signed)
Please call patient for normal laboratory result

## 2021-10-06 NOTE — Telephone Encounter (Signed)
I spoke to the patient and provided her with the lab results. 

## 2021-10-11 DIAGNOSIS — M5134 Other intervertebral disc degeneration, thoracic region: Secondary | ICD-10-CM | POA: Diagnosis not present

## 2021-10-11 DIAGNOSIS — M5032 Other cervical disc degeneration, mid-cervical region, unspecified level: Secondary | ICD-10-CM | POA: Diagnosis not present

## 2021-10-11 DIAGNOSIS — M9903 Segmental and somatic dysfunction of lumbar region: Secondary | ICD-10-CM | POA: Diagnosis not present

## 2021-10-11 DIAGNOSIS — M9902 Segmental and somatic dysfunction of thoracic region: Secondary | ICD-10-CM | POA: Diagnosis not present

## 2021-10-11 DIAGNOSIS — M9901 Segmental and somatic dysfunction of cervical region: Secondary | ICD-10-CM | POA: Diagnosis not present

## 2021-10-11 DIAGNOSIS — M5136 Other intervertebral disc degeneration, lumbar region: Secondary | ICD-10-CM | POA: Diagnosis not present

## 2021-10-18 DIAGNOSIS — M9902 Segmental and somatic dysfunction of thoracic region: Secondary | ICD-10-CM | POA: Diagnosis not present

## 2021-10-18 DIAGNOSIS — M5136 Other intervertebral disc degeneration, lumbar region: Secondary | ICD-10-CM | POA: Diagnosis not present

## 2021-10-18 DIAGNOSIS — M5134 Other intervertebral disc degeneration, thoracic region: Secondary | ICD-10-CM | POA: Diagnosis not present

## 2021-10-18 DIAGNOSIS — M9903 Segmental and somatic dysfunction of lumbar region: Secondary | ICD-10-CM | POA: Diagnosis not present

## 2021-10-18 DIAGNOSIS — M5032 Other cervical disc degeneration, mid-cervical region, unspecified level: Secondary | ICD-10-CM | POA: Diagnosis not present

## 2021-10-18 DIAGNOSIS — M9901 Segmental and somatic dysfunction of cervical region: Secondary | ICD-10-CM | POA: Diagnosis not present

## 2021-10-25 DIAGNOSIS — Z79899 Other long term (current) drug therapy: Secondary | ICD-10-CM | POA: Diagnosis not present

## 2021-10-25 DIAGNOSIS — Y92009 Unspecified place in unspecified non-institutional (private) residence as the place of occurrence of the external cause: Secondary | ICD-10-CM | POA: Diagnosis not present

## 2021-10-25 DIAGNOSIS — R109 Unspecified abdominal pain: Secondary | ICD-10-CM | POA: Diagnosis not present

## 2021-10-25 DIAGNOSIS — N3090 Cystitis, unspecified without hematuria: Secondary | ICD-10-CM | POA: Diagnosis not present

## 2021-10-25 DIAGNOSIS — W19XXXA Unspecified fall, initial encounter: Secondary | ICD-10-CM | POA: Diagnosis not present

## 2021-11-01 DIAGNOSIS — N3090 Cystitis, unspecified without hematuria: Secondary | ICD-10-CM | POA: Diagnosis not present

## 2021-11-01 DIAGNOSIS — R109 Unspecified abdominal pain: Secondary | ICD-10-CM | POA: Diagnosis not present

## 2021-11-01 DIAGNOSIS — S2232XD Fracture of one rib, left side, subsequent encounter for fracture with routine healing: Secondary | ICD-10-CM | POA: Diagnosis not present

## 2021-11-01 DIAGNOSIS — W19XXXD Unspecified fall, subsequent encounter: Secondary | ICD-10-CM | POA: Diagnosis not present

## 2021-11-01 DIAGNOSIS — Y92009 Unspecified place in unspecified non-institutional (private) residence as the place of occurrence of the external cause: Secondary | ICD-10-CM | POA: Diagnosis not present

## 2022-01-11 ENCOUNTER — Ambulatory Visit: Payer: Medicare Other | Admitting: Neurology

## 2022-02-24 DIAGNOSIS — R293 Abnormal posture: Secondary | ICD-10-CM | POA: Diagnosis not present

## 2022-02-24 DIAGNOSIS — R2681 Unsteadiness on feet: Secondary | ICD-10-CM | POA: Diagnosis not present

## 2022-02-24 DIAGNOSIS — R262 Difficulty in walking, not elsewhere classified: Secondary | ICD-10-CM | POA: Diagnosis not present

## 2022-02-24 DIAGNOSIS — M6281 Muscle weakness (generalized): Secondary | ICD-10-CM | POA: Diagnosis not present

## 2022-03-03 DIAGNOSIS — Z13820 Encounter for screening for osteoporosis: Secondary | ICD-10-CM | POA: Diagnosis not present

## 2022-03-03 DIAGNOSIS — Z780 Asymptomatic menopausal state: Secondary | ICD-10-CM | POA: Diagnosis not present

## 2022-03-03 DIAGNOSIS — M810 Age-related osteoporosis without current pathological fracture: Secondary | ICD-10-CM | POA: Diagnosis not present

## 2022-04-07 DIAGNOSIS — Z6820 Body mass index (BMI) 20.0-20.9, adult: Secondary | ICD-10-CM | POA: Diagnosis not present

## 2022-04-07 DIAGNOSIS — M810 Age-related osteoporosis without current pathological fracture: Secondary | ICD-10-CM | POA: Diagnosis not present
